# Patient Record
Sex: Female | Born: 2000 | Race: White | Hispanic: No | Marital: Single | Smoking: Never smoker
Health system: Southern US, Community
[De-identification: ages and names within clinical notes are randomized; demographics above are authoritative.]

---

## 2019-10-11 ENCOUNTER — Other Ambulatory Visit: Payer: Self-pay

## 2019-10-11 ENCOUNTER — Ambulatory Visit: Payer: Self-pay | Attending: Internal Medicine

## 2019-10-11 DIAGNOSIS — Z20822 Contact with and (suspected) exposure to covid-19: Secondary | ICD-10-CM | POA: Insufficient documentation

## 2019-10-12 LAB — NOVEL CORONAVIRUS, NAA: SARS-CoV-2, NAA: NOT DETECTED

## 2019-12-04 ENCOUNTER — Other Ambulatory Visit: Payer: Self-pay

## 2019-12-04 ENCOUNTER — Ambulatory Visit: Payer: HRSA Program | Attending: Internal Medicine

## 2019-12-04 DIAGNOSIS — Z20822 Contact with and (suspected) exposure to covid-19: Secondary | ICD-10-CM | POA: Insufficient documentation

## 2019-12-05 LAB — NOVEL CORONAVIRUS, NAA: SARS-CoV-2, NAA: NOT DETECTED

## 2020-05-09 ENCOUNTER — Other Ambulatory Visit: Payer: Self-pay

## 2020-05-09 DIAGNOSIS — Z20822 Contact with and (suspected) exposure to covid-19: Secondary | ICD-10-CM

## 2020-05-10 LAB — NOVEL CORONAVIRUS, NAA: SARS-CoV-2, NAA: NOT DETECTED

## 2020-05-10 LAB — SARS-COV-2, NAA 2 DAY TAT

## 2020-11-07 ENCOUNTER — Encounter (HOSPITAL_COMMUNITY): Payer: Self-pay | Admitting: Emergency Medicine

## 2020-11-07 ENCOUNTER — Other Ambulatory Visit: Payer: Self-pay

## 2020-11-07 ENCOUNTER — Ambulatory Visit (INDEPENDENT_AMBULATORY_CARE_PROVIDER_SITE_OTHER): Payer: BC Managed Care – PPO

## 2020-11-07 ENCOUNTER — Emergency Department (HOSPITAL_COMMUNITY)
Admission: EM | Admit: 2020-11-07 | Discharge: 2020-11-08 | Disposition: A | Discharge status: Home / Self Care | Payer: BC Managed Care – PPO | Attending: Emergency Medicine | Admitting: Emergency Medicine

## 2020-11-07 ENCOUNTER — Emergency Department (HOSPITAL_COMMUNITY): Payer: BC Managed Care – PPO

## 2020-11-07 ENCOUNTER — Ambulatory Visit (HOSPITAL_COMMUNITY)
Admission: EM | Admit: 2020-11-07 | Discharge: 2020-11-07 | Disposition: A | Discharge status: Home / Self Care | Payer: BC Managed Care – PPO | Attending: Urgent Care | Admitting: Urgent Care

## 2020-11-07 DIAGNOSIS — R519 Headache, unspecified: Secondary | ICD-10-CM

## 2020-11-07 DIAGNOSIS — S022XXB Fracture of nasal bones, initial encounter for open fracture: Secondary | ICD-10-CM

## 2020-11-07 DIAGNOSIS — J3489 Other specified disorders of nose and nasal sinuses: Secondary | ICD-10-CM

## 2020-11-07 DIAGNOSIS — S022XXA Fracture of nasal bones, initial encounter for closed fracture: Secondary | ICD-10-CM | POA: Insufficient documentation

## 2020-11-07 DIAGNOSIS — S0992XA Unspecified injury of nose, initial encounter: Secondary | ICD-10-CM | POA: Diagnosis present

## 2020-11-07 MED ORDER — HYDROCODONE-ACETAMINOPHEN 5-325 MG PO TABS
ORAL_TABLET | ORAL | Status: AC
  Filled 2020-11-07: qty 1

## 2020-11-07 MED ORDER — OXYCODONE-ACETAMINOPHEN 5-325 MG PO TABS
1.0000 | ORAL_TABLET | Freq: Once | ORAL | Status: AC
Start: 2020-11-07 — End: 2020-11-07
  Administered 2020-11-07: 1 via ORAL
  Filled 2020-11-07: qty 1

## 2020-11-07 MED ORDER — HYDROCODONE-ACETAMINOPHEN 5-325 MG PO TABS
1.0000 | ORAL_TABLET | Freq: Once | ORAL | Status: AC
  Administered 2020-11-07: 1 via ORAL
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
## 2020-11-15 ENCOUNTER — Encounter (INDEPENDENT_AMBULATORY_CARE_PROVIDER_SITE_OTHER): Payer: Self-pay | Admitting: Otolaryngology

## 2020-11-15 ENCOUNTER — Ambulatory Visit (INDEPENDENT_AMBULATORY_CARE_PROVIDER_SITE_OTHER): Payer: BC Managed Care – PPO | Admitting: Otolaryngology

## 2020-11-15 ENCOUNTER — Other Ambulatory Visit: Payer: Self-pay

## 2020-11-15 VITALS — Temp 97.7°F

## 2020-11-15 DIAGNOSIS — S022XXA Fracture of nasal bones, initial encounter for closed fracture: Secondary | ICD-10-CM
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
## 2021-03-26 ENCOUNTER — Ambulatory Visit (HOSPITAL_COMMUNITY)
Admission: EM | Admit: 2021-03-26 | Discharge: 2021-03-26 | Disposition: A | Discharge status: Home / Self Care | Payer: BC Managed Care – PPO | Attending: Urgent Care | Admitting: Urgent Care

## 2021-03-26 ENCOUNTER — Encounter (HOSPITAL_COMMUNITY): Payer: Self-pay | Admitting: Urgent Care

## 2021-03-26 ENCOUNTER — Other Ambulatory Visit: Payer: Self-pay

## 2021-03-26 DIAGNOSIS — Z20822 Contact with and (suspected) exposure to covid-19: Secondary | ICD-10-CM | POA: Insufficient documentation

## 2021-03-26 DIAGNOSIS — Z28310 Unvaccinated for covid-19: Secondary | ICD-10-CM | POA: Diagnosis not present

## 2021-03-26 DIAGNOSIS — R519 Headache, unspecified: Secondary | ICD-10-CM | POA: Insufficient documentation

## 2021-03-26 DIAGNOSIS — J069 Acute upper respiratory infection, unspecified: Secondary | ICD-10-CM

## 2021-03-26 DIAGNOSIS — R070 Pain in throat: Secondary | ICD-10-CM | POA: Insufficient documentation

## 2021-03-26 LAB — SARS CORONAVIRUS 2 (TAT 6-24 HRS): SARS Coronavirus 2: NEGATIVE

## 2021-03-26 LAB — POCT RAPID STREP A, ED / UC: Streptococcus, Group A Screen (Direct): NEGATIVE

## 2021-03-26 MED ORDER — PSEUDOEPHEDRINE HCL 60 MG PO TABS: 60.0000 mg | ORAL_TABLET | Freq: Three times a day (TID) | ORAL | 0 refills | Status: AC | PRN

## 2021-03-26 MED ORDER — FLUTICASONE PROPIONATE 50 MCG/ACT NA SUSP: 2.0000 | Freq: Every day | NASAL | 12 refills | Status: AC

## 2021-03-26 MED ORDER — CETIRIZINE HCL 10 MG PO TABS: 10.0000 mg | ORAL_TABLET | Freq: Every day | ORAL | 0 refills | Status: AC

## 2021-03-26 NOTE — ED Triage Notes (Signed)
Pt in with c/o headache, ST, and fever tmax 102.3 that started yesterday  Pt has taken advil with temporary relief

## 2021-03-26 NOTE — ED Provider Notes (Signed)
[name]   MRN: [id number] DOB: 2000/12/17  Subjective:   Charlotte Adams is a 20 y.o. female presenting for 1 day history of acute onset fever, throat pain, sinus headaches. Has taken Advil with minimal relief. Denies chest pain, shob, body aches. No COVID vaccinations.   No current facility-administered medications for this encounter. No current outpatient medications on file.   No Known Allergies  History reviewed. No pertinent past medical history.   History reviewed. No pertinent surgical history.  History reviewed. No pertinent family history.  Social History   Tobacco Use   Smoking status: Never   Smokeless tobacco: Never    ROS   Objective:   Vitals: BP 115/65 (BP Location: Right Arm)   Pulse 90   Temp 100 F (37.8 C) (Oral)   Resp 18   LMP 03/23/2021   SpO2 97%   Physical Exam Constitutional:      General: She is not in acute distress.    Appearance: Normal appearance. She is well-developed. She is not ill-appearing, toxic-appearing or diaphoretic.  HENT:     Head: Normocephalic and atraumatic.     Right Ear: Tympanic membrane, ear canal and external ear normal. No drainage or tenderness. No middle ear effusion. Tympanic membrane is not erythematous.     Left Ear: Tympanic membrane, ear canal and external ear normal. No drainage or tenderness.  No middle ear effusion. Tympanic membrane is not erythematous.     Nose: Nose normal. No congestion or rhinorrhea.     Mouth/Throat:     Mouth: Mucous membranes are moist. No oral lesions.     Pharynx: No pharyngeal swelling, oropharyngeal exudate, posterior oropharyngeal erythema or uvula swelling.     Tonsils: No tonsillar exudate or tonsillar abscesses.  Eyes:     General: No scleral icterus.       Right eye: No discharge.        Left eye: No discharge.     Extraocular Movements: Extraocular movements intact.     Right eye: Normal extraocular motion.     Left eye: Normal extraocular  motion.     Conjunctiva/sclera: Conjunctivae normal.     Pupils: Pupils are equal, round, and reactive to light.  Cardiovascular:     Rate and Rhythm: Normal rate.  Pulmonary:     Effort: Pulmonary effort is normal.  Musculoskeletal:     Cervical back: Normal range of motion and neck supple.  Lymphadenopathy:     Cervical: No cervical adenopathy.  Skin:    General: Skin is warm and dry.  Neurological:     General: No focal deficit present.     Mental Status: She is alert and oriented to person, place, and time.     Cranial Nerves: No cranial nerve deficit.     Motor: No weakness.  Psychiatric:        Mood and Affect: Mood normal.        Behavior: Behavior normal.        Thought Content: Thought content normal.        Judgment: Judgment normal.    Results for orders placed or performed during the hospital encounter of 03/26/21 (from the past 24 hour(s))  POCT Rapid Strep A     Status: None   Collection Time: 03/26/21  1:49 PM  Result Value Ref Range   Streptococcus, Group A Screen (Direct) NEGATIVE NEGATIVE    Assessment and Plan :   PDMP not reviewed this encounter.  1.  Viral URI   2. Acute nonintractable headache, unspecified headache type   3. Throat pain     Will manage for viral illness such as viral URI, viral syndrome, viral rhinitis, COVID-19. Counseled patient on nature of COVID-19 including modes of transmission, diagnostic testing, management and supportive care.  Offered scripts for symptomatic relief. COVID 19 testing is pending. Counseled patient on potential for adverse effects with medications prescribed/recommended today, ER and return-to-clinic precautions discussed, patient verbalized understanding.     Wallis Bamberg, PA-C 03/26/21 1408

## 2021-03-26 NOTE — Discharge Instructions (Addendum)

## 2021-03-28 LAB — CULTURE, GROUP A STREP (THRC)
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
